# Patient Record
Sex: Female | Born: 1955 | Hispanic: No | Smoking: Never smoker
Health system: Southern US, Community
[De-identification: ages and names within clinical notes are randomized; demographics above are authoritative.]

## PROBLEM LIST (undated history)

## (undated) HISTORY — PX: ROTATOR CUFF REPAIR: SHX139

## (undated) HISTORY — PX: APPENDECTOMY: SHX54

## (undated) HISTORY — PX: TONSILLECTOMY: SUR1361

---

## 2000-08-05 ENCOUNTER — Other Ambulatory Visit: Admission: RE | Admit: 2000-08-05 | Discharge: 2000-08-05 | Payer: Self-pay | Admitting: *Deleted

## 2017-06-07 ENCOUNTER — Other Ambulatory Visit: Payer: Self-pay

## 2017-06-07 ENCOUNTER — Emergency Department (HOSPITAL_BASED_OUTPATIENT_CLINIC_OR_DEPARTMENT_OTHER): Payer: 59

## 2017-06-07 ENCOUNTER — Encounter (HOSPITAL_BASED_OUTPATIENT_CLINIC_OR_DEPARTMENT_OTHER): Payer: Self-pay

## 2017-06-07 ENCOUNTER — Emergency Department (HOSPITAL_BASED_OUTPATIENT_CLINIC_OR_DEPARTMENT_OTHER)
Admission: EM | Admit: 2017-06-07 | Discharge: 2017-06-07 | Disposition: A | Discharge status: Home / Self Care | Payer: 59 | Attending: Emergency Medicine | Admitting: Emergency Medicine

## 2017-06-07 DIAGNOSIS — S6991XA Unspecified injury of right wrist, hand and finger(s), initial encounter: Secondary | ICD-10-CM | POA: Diagnosis present

## 2017-06-07 DIAGNOSIS — Y999 Unspecified external cause status: Secondary | ICD-10-CM | POA: Diagnosis not present

## 2017-06-07 DIAGNOSIS — Z23 Encounter for immunization: Secondary | ICD-10-CM | POA: Insufficient documentation

## 2017-06-07 DIAGNOSIS — Y929 Unspecified place or not applicable: Secondary | ICD-10-CM | POA: Insufficient documentation

## 2017-06-07 DIAGNOSIS — S61511A Laceration without foreign body of right wrist, initial encounter: Secondary | ICD-10-CM | POA: Insufficient documentation

## 2017-06-07 DIAGNOSIS — W540XXA Bitten by dog, initial encounter: Secondary | ICD-10-CM | POA: Diagnosis not present

## 2017-06-07 DIAGNOSIS — M79642 Pain in left hand: Secondary | ICD-10-CM

## 2017-06-07 DIAGNOSIS — Y939 Activity, unspecified: Secondary | ICD-10-CM | POA: Diagnosis not present

## 2017-06-07 MED ORDER — AMOXICILLIN-POT CLAVULANATE 875-125 MG PO TABS: 1.0000 | ORAL_TABLET | Freq: Two times a day (BID) | ORAL | 0 refills | Status: AC

## 2017-06-07 MED ORDER — TETANUS-DIPHTH-ACELL PERTUSSIS 5-2.5-18.5 LF-MCG/0.5 IM SUSP
0.5000 mL | Freq: Once | INTRAMUSCULAR | Status: AC
  Administered 2017-06-07: 0.5 mL via INTRAMUSCULAR
  Filled 2017-06-07: qty 0.5

## 2017-06-07 MED ORDER — LIDOCAINE HCL (PF) 1 % IJ SOLN
30.0000 mL | Freq: Once | INTRAMUSCULAR | Status: AC
  Administered 2017-06-07: 30 mL
  Filled 2017-06-07: qty 30

## 2017-06-07 NOTE — ED Triage Notes (Signed)
Pt presents with multiple puncture wounds to bilat hands and wrists, pt has 1cm lac to right wrist, bruising and swelling to left pinky. Bleeding controlled. Pt's dogs were eating and began "fighting", pt got in the middle to separate the dogs. Pt reports dogs vaccinations are up to date.

## 2017-06-07 NOTE — ED Notes (Signed)
Ambulatory to xray, no changes.

## 2017-06-07 NOTE — ED Notes (Signed)
Alert, NAD, calm, interactive, resps e/u, speaking in clear complete sentences, no dyspnea noted, skin W&D, initial VSS, BP elevated, c/o bilateral wrist, hand, finger injuries r/t dog bite while separating her dogs while they eating, c/o pain, soreness and inability to bend L 5th digit, (denies: sob, nausea, dizziness or visual changes). EDP into room. States, "need my Tdap shot".

## 2017-06-07 NOTE — Discharge Instructions (Signed)
You were seen in the ED today with dog bites which required laceration repair. Keep the wounds clean and dry. Cover as needed and you can apply an antibiotic ointment. Take the oral antibiotics to prevent infection in the wounds. Return to the ED for suture removal in 7 days or your PCP can remove them.

## 2017-06-07 NOTE — ED Notes (Signed)
[facility]

## 2017-06-07 NOTE — ED Provider Notes (Signed)
Emergency Department Provider Note   I have reviewed the triage vital signs and the nursing notes.   HISTORY  Chief Complaint Animal Bite   HPI Crystal Weaver is a 61 y.o. female presents to the emergency department for evaluation of multiple puncture wounds to the bilateral upper extremities.  The patient was dog's and he began food.  She tried to intervene with her hands but sustained several bites to her fingers and wrists.  She has a larger laceration over the right wrist some continued oozing blood.  Unsure when her last tetanus shot was.  The dogs are her personal pets and are up-to-date on rabies vaccines.  No numbness or tingling in the hands.  She has some swelling in the fingers of the right hand with some discomfort upon range of motion.    History reviewed. No pertinent past medical history.  There are no active problems to display for this patient.   Past Surgical History:  Procedure Laterality Date  . APPENDECTOMY    . ROTATOR CUFF REPAIR Left   . TONSILLECTOMY      Current Outpatient Rx  . Order #: [id number] Class: Historical Med  . Order #: [id number] Class: Historical Med  . Order #: [id number] Class: Print    Allergies Patient has no allergy information on record.  No family history on file.  Social History Social History   Tobacco Use  . Smoking status: Never Smoker  . Smokeless tobacco: Never Used  Substance Use Topics  . Alcohol use: No    Frequency: Never  . Drug use: No    Review of Systems  Constitutional: No fever/chills Eyes: No visual changes. ENT: No sore throat. Cardiovascular: Denies chest pain. Respiratory: Denies shortness of breath. Gastrointestinal: No abdominal pain.  No nausea, no vomiting.  No diarrhea.  No constipation. Genitourinary: Negative for dysuria. Musculoskeletal: Negative for back pain. Skin: Multiple lacerations over bilateral hands, wrist, and forearms.  Neurological: Negative for headaches, focal weakness  or numbness.  10-point ROS otherwise negative.  ____________________________________________   PHYSICAL EXAM:  VITAL SIGNS: ED Triage Vitals  Enc Vitals Group     BP 06/07/17 2131 (!) 149/90     Pulse Rate 06/07/17 2131 61     Resp 06/07/17 2131 18     Temp 06/07/17 2131 97.8 F (36.6 C)     Temp Source 06/07/17 2131 Oral     SpO2 06/07/17 2131 99 %     Weight 06/07/17 2132 160 lb (72.6 kg)     Height 06/07/17 2132 5\' 9"  (1.753 m)     Pain Score 06/07/17 2131 2   Constitutional: Alert and oriented. Well appearing and in no acute distress. Eyes: Conjunctivae are normal. Head: Atraumatic. Nose: No congestion/rhinnorhea. Mouth/Throat: Mucous membranes are moist. Neck: No stridor.  Cardiovascular: Normal rate, regular rhythm. Good peripheral circulation. Grossly normal heart sounds.   Respiratory: Normal respiratory effort.  No retractions. Lungs CTAB. Gastrointestinal: Soft and nontender. No distention.  Musculoskeletal: No lower extremity tenderness nor edema. No gross deformities of extremities. Neurologic:  Normal speech and language. No gross focal neurologic deficits are appreciated.  Skin:  Skin is warm and dry. Multiple small abrasions and puncture wounds over the bilateral hands and wrists. Laceration over the volar aspect of the right wrist with oozing blood.   ____________________________________________  RADIOLOGY  Dg Wrist Complete Right  Result Date: 06/07/2017 CLINICAL DATA:  Dog bite. EXAM: RIGHT WRIST - COMPLETE 3+ VIEW COMPARISON:  None. FINDINGS: There is no evidence  of fracture or dislocation. There is no evidence of arthropathy or other focal bone abnormality. Osteopenia. Dorsal wrist soft tissue swelling with minimal subcutaneous gas. No radiopaque foreign bodies. IMPRESSION: Soft tissue swelling and minimal subcutaneous gas consistent with history of puncture wound. No acute osseous process. Electronically Signed   By: Awilda Metroourtnay  Bloomer M.D.   On:  06/07/2017 22:28   Dg Hand Complete Left  Result Date: 06/07/2017 CLINICAL DATA:  Dog bite to hand. EXAM: LEFT HAND - COMPLETE 3+ VIEW COMPARISON:  None. FINDINGS: There is no evidence of fracture or dislocation. There is no evidence of arthropathy or other focal bone abnormality. Osteopenia. Punctate radiopaque foreign body projecting in mid palmar soft tissues. No soft tissue swelling. IMPRESSION: Punctate radiopaque foreign body in palmar soft tissues. No acute osseous process. Electronically Signed   By: Awilda Metroourtnay  Bloomer M.D.   On: 06/07/2017 22:29    ____________________________________________   PROCEDURES  Procedure(s) performed:   Marland Kitchen.Marland Kitchen.Laceration Repair Date/Time: 06/07/2017 11:06 PM Performed by: Maia PlanLong, Joshua G, MD Authorized by: Maia PlanLong, Joshua G, MD   Consent:    Consent obtained:  Verbal   Consent given by:  Patient   Risks discussed:  Infection, pain, retained foreign body, vascular damage, tendon damage, poor cosmetic result, poor wound healing, need for additional repair and nerve damage   Alternatives discussed:  No treatment Anesthesia (see MAR for exact dosages):    Anesthesia method:  Local infiltration   Local anesthetic:  Lidocaine 1% w/o epi Laceration details:    Location:  Hand   Hand location:  R wrist   Length (cm):  2 Repair type:    Repair type:  Simple Pre-procedure details:    Preparation:  Patient was prepped and draped in usual sterile fashion Exploration:    Hemostasis achieved with:  Direct pressure   Wound exploration: wound explored through full range of motion and entire depth of wound probed and visualized     Wound extent: no foreign bodies/material noted, no muscle damage noted, no nerve damage noted, no tendon damage noted, no underlying fracture noted and no vascular damage noted     Contaminated: yes   Treatment:    Area cleansed with:  Betadine and saline   Amount of cleaning:  Extensive   Irrigation solution:  Sterile saline    Irrigation method:  Pressure wash   Visualized foreign bodies/material removed: no   Skin repair:    Repair method:  Sutures   Suture size:  5-0   Suture material:  Prolene   Suture technique:  Simple interrupted   Number of sutures:  4 Approximation:    Approximation:  Loose   Vermilion border: well-aligned   Post-procedure details:    Dressing:  Antibiotic ointment and bulky dressing   Patient tolerance of procedure:  Tolerated well, no immediate complications     ____________________________________________   INITIAL IMPRESSION / ASSESSMENT AND PLAN / ED COURSE  Pertinent labs & imaging results that were available during my care of the patient were reviewed by me and considered in my medical decision making (see chart for details).  Patient presents to the emergency department for evaluation of multiple lacerations and puncture wounds to the bilateral hand.  She was breaking up her own dogs during a fight of her food.  Dogs are up-to-date on rabies vaccines.  Plan to update tetanus, obtain plain films to rule out underlying fracture or retained foreign body, and suture the laceration on the right wrist closed. Plan for abx at  discharge.   Laceration repaired as above.  Additional wounds were cleaned and applied bacitracin.  Starting patient on Augmentin for 7 days and she will return in 1 week for suture removal.  Some swelling and pain to the left pinky but no bony injury.  Advised ice, elevation, and Tylenol/Motrin as needed for discomfort.  At this time, I do not feel there is any life-threatening condition present. I have reviewed and discussed all results (EKG, imaging, lab, urine as appropriate), exam findings with patient. I have reviewed nursing notes and appropriate previous records.  I feel the patient is safe to be discharged home without further emergent workup. Discussed usual and customary return precautions. Patient and family (if present) verbalize understanding and  are comfortable with this plan.  Patient will follow-up with their primary care provider. If they do not have a primary care provider, information for follow-up has been provided to them. All questions have been answered.  ____________________________________________  FINAL CLINICAL IMPRESSION(S) / ED DIAGNOSES  Final diagnoses:  Dog bite, initial encounter  Laceration of right wrist, initial encounter  Left hand pain     MEDICATIONS GIVEN DURING THIS VISIT:  Medications  Tdap (BOOSTRIX) injection 0.5 mL (0.5 mLs Intramuscular Given 06/07/17 2230)  lidocaine (PF) (XYLOCAINE) 1 % injection 30 mL (30 mLs Infiltration Given 06/07/17 2231)     NEW OUTPATIENT MEDICATIONS STARTED DURING THIS VISIT:  Augmentin   Note:  This document was prepared using Dragon voice recognition software and may include unintentional dictation errors.  Alona Bene, MD Emergency Medicine    Long, Arlyss Repress, MD 06/07/17 2308

## 2017-06-07 NOTE — ED Notes (Signed)
[facility]

## 2018-05-14 IMAGING — DX DG HAND COMPLETE 3+V*L*
3 series · 3 of 3 positions shown · non-contrast
Comparison: None.

CLINICAL DATA: Dog bite to hand.

EXAM:
LEFT HAND - COMPLETE 3+ VIEW

[hand pa]
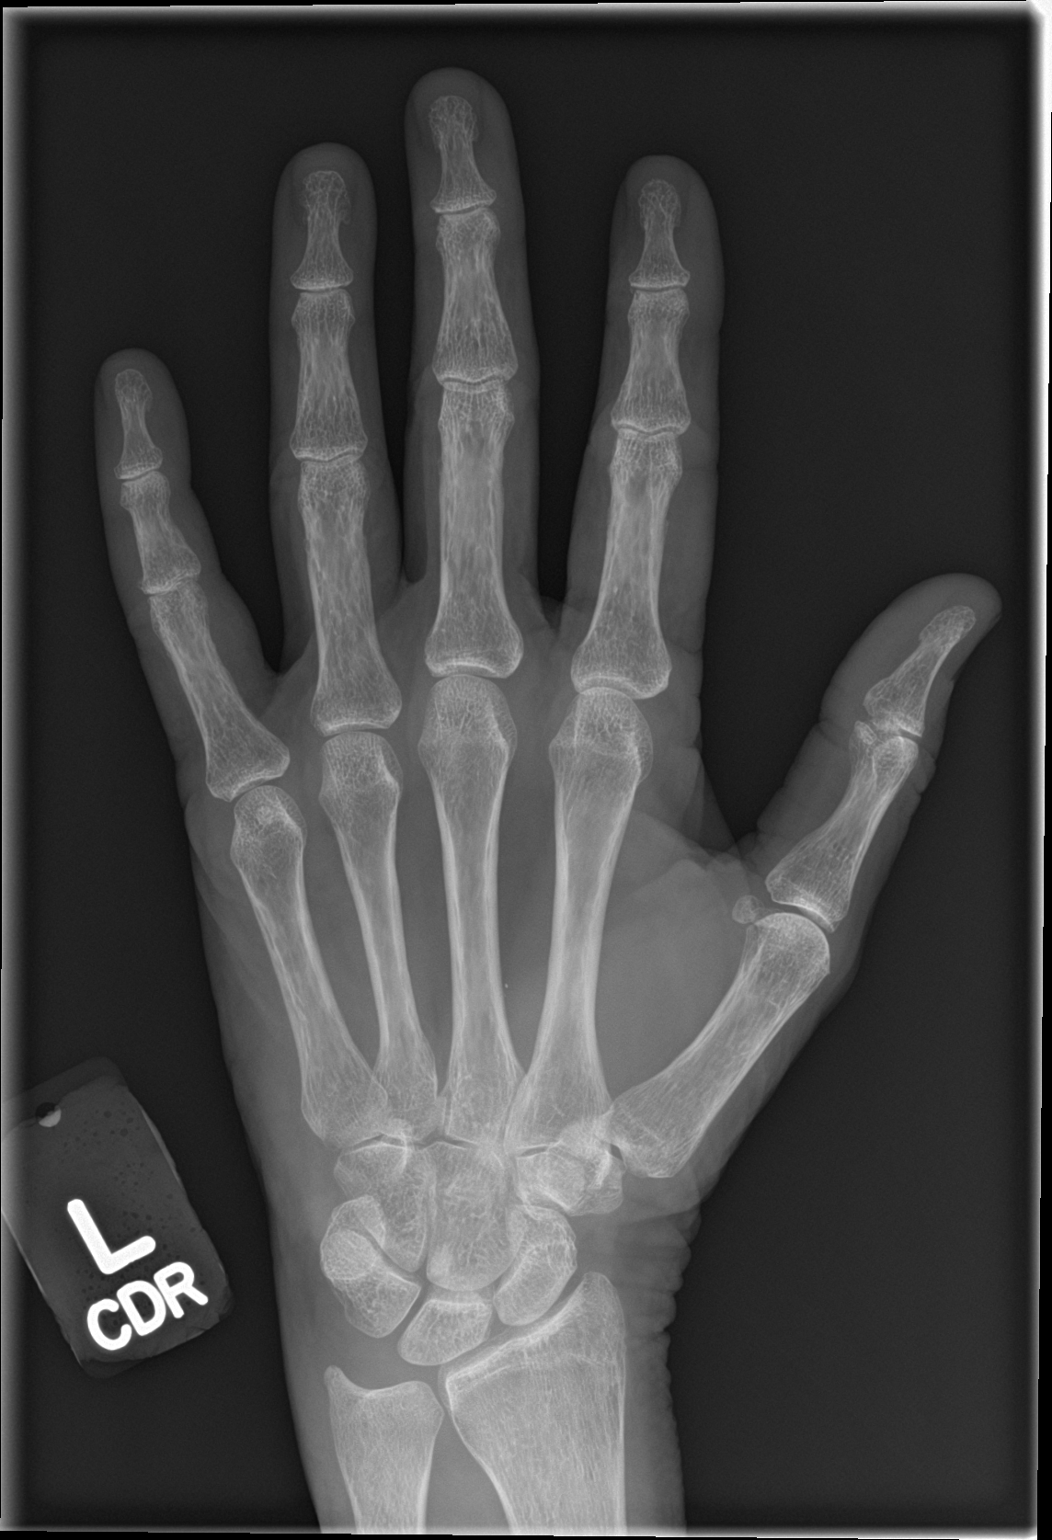

[hand obl]
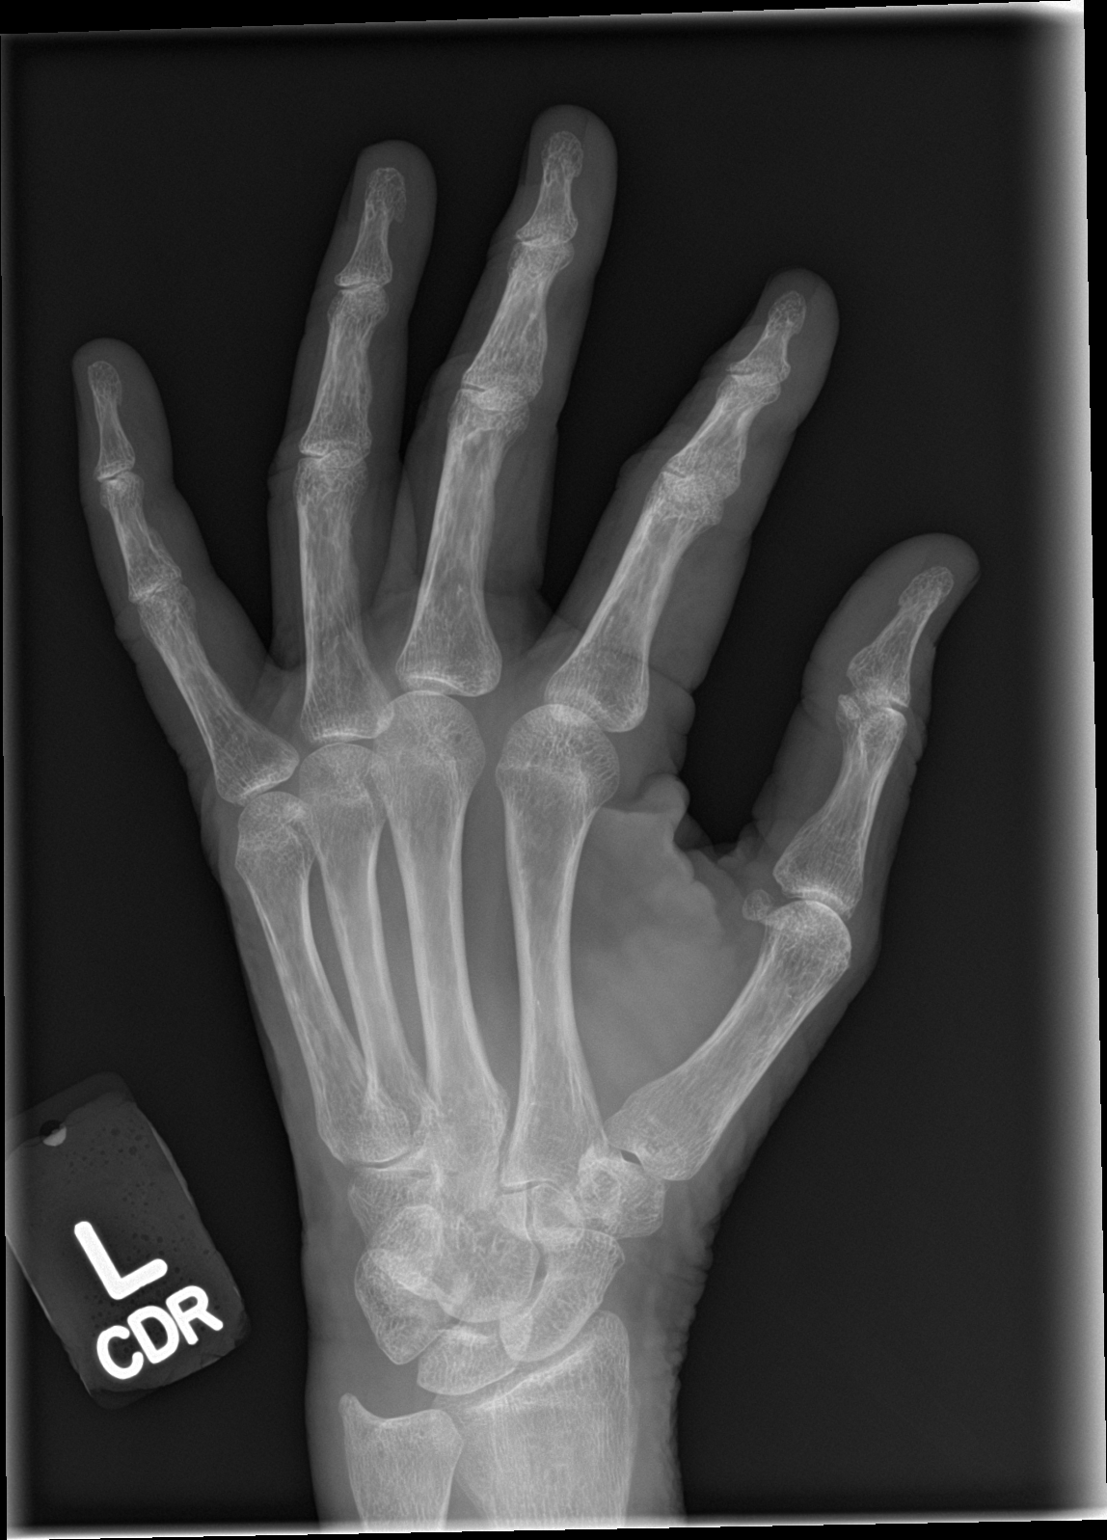

[hand lat]
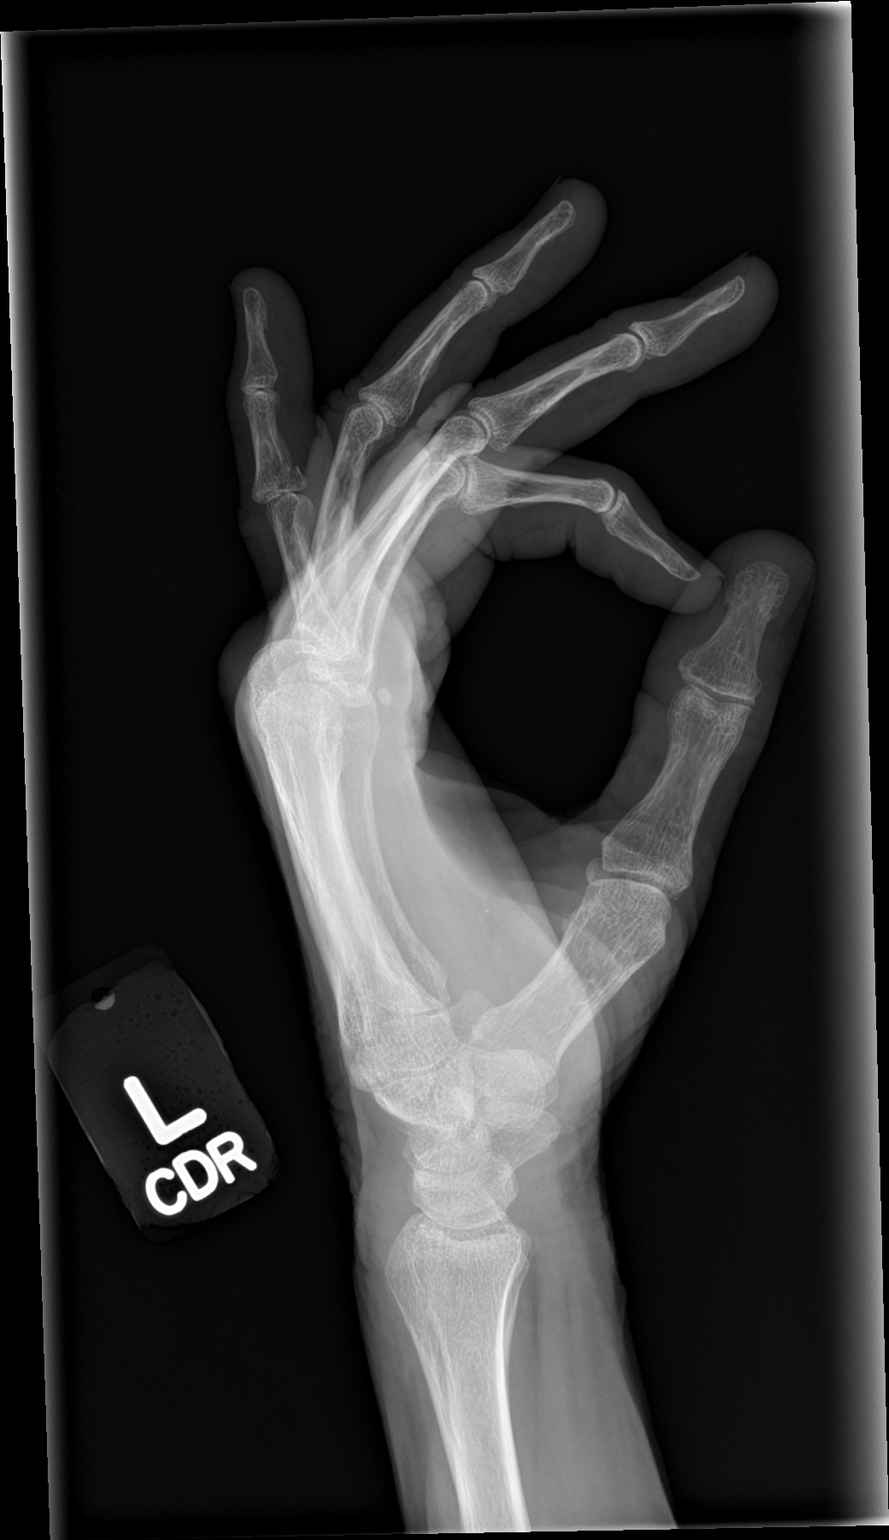

[3 of 3 positions shown; findings below may reference images not displayed]

FINDINGS: There is no evidence of fracture or dislocation. There is no
evidence of arthropathy or other focal bone abnormality. Osteopenia.
Punctate radiopaque foreign body projecting in mid palmar soft
tissues. No soft tissue swelling.
IMPRESSION: Punctate radiopaque foreign body in palmar soft tissues. No acute
osseous process.
